# Patient Record
Sex: Female | Born: 1965 | Race: White | Hispanic: No | Marital: Married | Smoking: Never smoker
Health system: Southern US, Community
[De-identification: ages and names within clinical notes are randomized; demographics above are authoritative.]

---

## 2018-09-10 ENCOUNTER — Encounter (HOSPITAL_COMMUNITY): Payer: Self-pay | Admitting: Emergency Medicine

## 2018-09-10 ENCOUNTER — Emergency Department (HOSPITAL_COMMUNITY)
Admission: EM | Admit: 2018-09-10 | Discharge: 2018-09-11 | Discharge status: Against Medical Advice | Payer: 59 | Attending: Emergency Medicine | Admitting: Emergency Medicine

## 2018-09-10 ENCOUNTER — Other Ambulatory Visit: Payer: Self-pay

## 2018-09-10 DIAGNOSIS — Z5321 Procedure and treatment not carried out due to patient leaving prior to being seen by health care provider: Secondary | ICD-10-CM | POA: Diagnosis not present

## 2018-09-10 DIAGNOSIS — Z0441 Encounter for examination and observation following alleged adult rape: Secondary | ICD-10-CM | POA: Diagnosis not present

## 2018-09-10 DIAGNOSIS — N939 Abnormal uterine and vaginal bleeding, unspecified: Secondary | ICD-10-CM | POA: Diagnosis present
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
## 2018-09-11 ENCOUNTER — Emergency Department (HOSPITAL_COMMUNITY): Payer: 59

## 2018-09-11 ENCOUNTER — Other Ambulatory Visit: Payer: Self-pay

## 2018-09-11 ENCOUNTER — Encounter (HOSPITAL_COMMUNITY): Payer: Self-pay

## 2018-09-11 ENCOUNTER — Ambulatory Visit (HOSPITAL_COMMUNITY)
Admission: EM | Admit: 2018-09-11 | Discharge: 2018-09-11 | Disposition: A | Discharge status: Home / Self Care | Payer: No Typology Code available for payment source | Attending: Emergency Medicine | Admitting: Emergency Medicine

## 2018-09-11 ENCOUNTER — Emergency Department (HOSPITAL_COMMUNITY)
Admission: EM | Admit: 2018-09-11 | Discharge: 2018-09-11 | Disposition: A | Discharge status: Home / Self Care | Payer: 59 | Source: Home / Self Care | Attending: Emergency Medicine | Admitting: Emergency Medicine

## 2018-09-11 DIAGNOSIS — S5011XA Contusion of right forearm, initial encounter: Secondary | ICD-10-CM

## 2018-09-11 DIAGNOSIS — Y9289 Other specified places as the place of occurrence of the external cause: Secondary | ICD-10-CM | POA: Insufficient documentation

## 2018-09-11 DIAGNOSIS — K625 Hemorrhage of anus and rectum: Secondary | ICD-10-CM | POA: Insufficient documentation

## 2018-09-11 DIAGNOSIS — S5012XA Contusion of left forearm, initial encounter: Secondary | ICD-10-CM

## 2018-09-11 DIAGNOSIS — S8011XA Contusion of right lower leg, initial encounter: Secondary | ICD-10-CM

## 2018-09-11 DIAGNOSIS — Z0441 Encounter for examination and observation following alleged adult rape: Secondary | ICD-10-CM | POA: Insufficient documentation

## 2018-09-11 DIAGNOSIS — Y939 Activity, unspecified: Secondary | ICD-10-CM | POA: Insufficient documentation

## 2018-09-11 DIAGNOSIS — N939 Abnormal uterine and vaginal bleeding, unspecified: Secondary | ICD-10-CM | POA: Insufficient documentation

## 2018-09-11 DIAGNOSIS — Y999 Unspecified external cause status: Secondary | ICD-10-CM

## 2018-09-11 DIAGNOSIS — S8012XA Contusion of left lower leg, initial encounter: Secondary | ICD-10-CM

## 2018-09-11 DIAGNOSIS — E876 Hypokalemia: Secondary | ICD-10-CM

## 2018-09-11 LAB — URINALYSIS, ROUTINE W REFLEX MICROSCOPIC
Bilirubin Urine: NEGATIVE
Glucose, UA: NEGATIVE mg/dL
Ketones, ur: NEGATIVE mg/dL
Nitrite: NEGATIVE
Protein, ur: NEGATIVE mg/dL
Specific Gravity, Urine: 1.014 (ref 1.005–1.030)
pH: 7 (ref 5.0–8.0)

## 2018-09-11 LAB — CBC WITH DIFFERENTIAL/PLATELET
Abs Immature Granulocytes: 0.02 10*3/uL (ref 0.00–0.07)
Basophils Absolute: 0 10*3/uL (ref 0.0–0.1)
Basophils Relative: 1 %
EOS PCT: 2 %
Eosinophils Absolute: 0.1 10*3/uL (ref 0.0–0.5)
HCT: 36 % (ref 36.0–46.0)
Hemoglobin: 11.4 g/dL — ABNORMAL LOW (ref 12.0–15.0)
Immature Granulocytes: 0 %
LYMPHS PCT: 32 %
Lymphs Abs: 2 10*3/uL (ref 0.7–4.0)
MCH: 27.7 pg (ref 26.0–34.0)
MCHC: 31.7 g/dL (ref 30.0–36.0)
MCV: 87.6 fL (ref 80.0–100.0)
Monocytes Absolute: 0.6 10*3/uL (ref 0.1–1.0)
Monocytes Relative: 10 %
Neutro Abs: 3.4 10*3/uL (ref 1.7–7.7)
Neutrophils Relative %: 55 %
Platelets: 235 10*3/uL (ref 150–400)
RBC: 4.11 MIL/uL (ref 3.87–5.11)
RDW: 14 % (ref 11.5–15.5)
WBC: 6.1 10*3/uL (ref 4.0–10.5)
nRBC: 0 % (ref 0.0–0.2)

## 2018-09-11 LAB — RAPID URINE DRUG SCREEN, HOSP PERFORMED
Amphetamines: NOT DETECTED
Barbiturates: NOT DETECTED
Benzodiazepines: NOT DETECTED
COCAINE: POSITIVE — AB
Opiates: NOT DETECTED
Tetrahydrocannabinol: POSITIVE — AB

## 2018-09-11 LAB — COMPREHENSIVE METABOLIC PANEL
ALT: 17 U/L (ref 0–44)
AST: 26 U/L (ref 15–41)
Albumin: 4 g/dL (ref 3.5–5.0)
Alkaline Phosphatase: 60 U/L (ref 38–126)
Anion gap: 10 (ref 5–15)
BUN: 6 mg/dL (ref 6–20)
CALCIUM: 9.4 mg/dL (ref 8.9–10.3)
CO2: 18 mmol/L — ABNORMAL LOW (ref 22–32)
Chloride: 110 mmol/L (ref 98–111)
Creatinine, Ser: 0.91 mg/dL (ref 0.44–1.00)
GFR calc Af Amer: >60 mL/min (ref >60)
Glucose, Bld: 95 mg/dL (ref 70–99)
Potassium: 2.9 mmol/L — ABNORMAL LOW (ref 3.5–5.1)
Sodium: 138 mmol/L (ref 135–145)
TOTAL PROTEIN: 7.1 g/dL (ref 6.5–8.1)
Total Bilirubin: 0.7 mg/dL (ref 0.3–1.2)

## 2018-09-11 LAB — ETHANOL: Alcohol, Ethyl (B): <10 mg/dL (ref <10)

## 2018-09-11 MED ORDER — AZITHROMYCIN 250 MG PO TABS
1000.0000 mg | ORAL_TABLET | Freq: Once | ORAL | Status: AC
  Administered 2018-09-11: 1000 mg via ORAL
  Filled 2018-09-11: qty 4

## 2018-09-11 MED ORDER — METRONIDAZOLE 500 MG PO TABS
2000.0000 mg | ORAL_TABLET | Freq: Once | ORAL | Status: AC
  Administered 2018-09-11: 2000 mg via ORAL
  Filled 2018-09-11: qty 4

## 2018-09-11 MED ORDER — CEFTRIAXONE SODIUM 250 MG IJ SOLR
250.0000 mg | Freq: Once | INTRAMUSCULAR | Status: AC
  Administered 2018-09-11: 250 mg via INTRAMUSCULAR
  Filled 2018-09-11: qty 250

## 2018-09-11 MED ORDER — LIDOCAINE HCL (PF) 1 % IJ SOLN
0.9000 mL | Freq: Once | INTRAMUSCULAR | Status: AC
  Administered 2018-09-11: 0.9 mL
  Filled 2018-09-11: qty 5
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
## 2018-09-11 NOTE — ED Notes (Signed)
SANE RN at bedside.
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
## 2018-09-11 NOTE — ED Notes (Signed)
Manufacturing systems engineer notified and communication with this RN has been completed. She will be in route to our facility.
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
## 2018-09-11 NOTE — ED Notes (Signed)
Patient transported to X-ray 
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
## 2018-09-12 LAB — RPR: RPR Ser Ql: NONREACTIVE

## 2018-09-12 LAB — HIV ANTIBODY (ROUTINE TESTING W REFLEX): HIV Screen 4th Generation wRfx: NONREACTIVE

## 2018-09-13 LAB — KETAMINE, URINE QUAL: Ketamine, Urine: NEGATIVE ng/mL

## 2018-09-15 LAB — CARISOPRODOL (SOMA), SERUM
Carisoprodol, Serum: <1 ug/mL (ref <20.0)
Meprobamate, Serum: <1 ug/mL — ABNORMAL LOW (ref 6.0–12.0)

## 2018-09-16 LAB — KETAMINE AND MTB. QT, S/P
Ketamine: NEGATIVE ng/mL
Norketamine: NEGATIVE ng/mL
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
## 2018-09-22 LAB — GHB SCREEN, S/P: GHB: NEGATIVE

## 2018-09-23 ENCOUNTER — Other Ambulatory Visit: Payer: Self-pay

## 2018-09-23 ENCOUNTER — Encounter (HOSPITAL_COMMUNITY): Payer: Self-pay

## 2018-09-23 ENCOUNTER — Ambulatory Visit (HOSPITAL_COMMUNITY)
Admission: EM | Admit: 2018-09-23 | Discharge: 2018-09-23 | Disposition: A | Discharge status: Home / Self Care | Payer: 59 | Attending: Family Medicine | Admitting: Family Medicine

## 2018-09-23 DIAGNOSIS — Z202 Contact with and (suspected) exposure to infections with a predominantly sexual mode of transmission: Secondary | ICD-10-CM | POA: Diagnosis not present

## 2018-09-23 DIAGNOSIS — Z113 Encounter for screening for infections with a predominantly sexual mode of transmission: Secondary | ICD-10-CM | POA: Insufficient documentation

## 2018-09-23 NOTE — Discharge Instructions (Addendum)
We are testing you for gonorrhea, chlamydia and trichomonas. Your HIV and syphilis test were negative.  Your hepatitis B was negative. Keep in contact with the hospital to try to get records for hepatitis B injections Follow-up with your hepatitis C screening as scheduled and you may want a repeat HIV testing in a few months. You were treated for gonorrhea, chlamydia and trichomonas at the hospital. Follow up as needed for continued or worsening symptoms
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
## 2018-09-24 LAB — CERVICOVAGINAL ANCILLARY ONLY
Bacterial vaginitis: POSITIVE — AB
Candida vaginitis: NEGATIVE
Chlamydia: NEGATIVE
Neisseria Gonorrhea: NEGATIVE
Trichomonas: NEGATIVE

## 2018-09-25 ENCOUNTER — Telehealth (HOSPITAL_COMMUNITY): Payer: Self-pay | Admitting: Emergency Medicine

## 2018-09-25 MED ORDER — METRONIDAZOLE 500 MG PO TABS: 500.0000 mg | ORAL_TABLET | Freq: Two times a day (BID) | ORAL | 0 refills | Status: AC

## 2018-09-25 NOTE — Telephone Encounter (Signed)
Bacterial vaginosis is positive. This was not treated at the urgent care visit.  Flagyl 500 mg BID x 7 days #14 no refills sent to patients pharmacy of choice.    Patient contacted and made aware of all results, all questions answered.  

## 2018-09-29 NOTE — [facility] Note (Signed)
09/29/2018 at 0810am  Sandra Woodward had left a VM asking for drug test results.  This FNE called to advise her that results were positive for cocaine and marijuana on her visit of March 5th.  Pt states, "I don't do cocaine."  There was a long pause.  Then pt verbalized appreciation for return phone call.

## 2020-03-14 IMAGING — DX DG ABDOMEN 1V
1 series · 1 of 1 positions shown · non-contrast
Comparison: None.

CLINICAL DATA: Bloody diarrhea for 4 days. No fever. Abdominal
pain.

EXAM:
ABDOMEN - 1 VIEW

[abdomen kub]
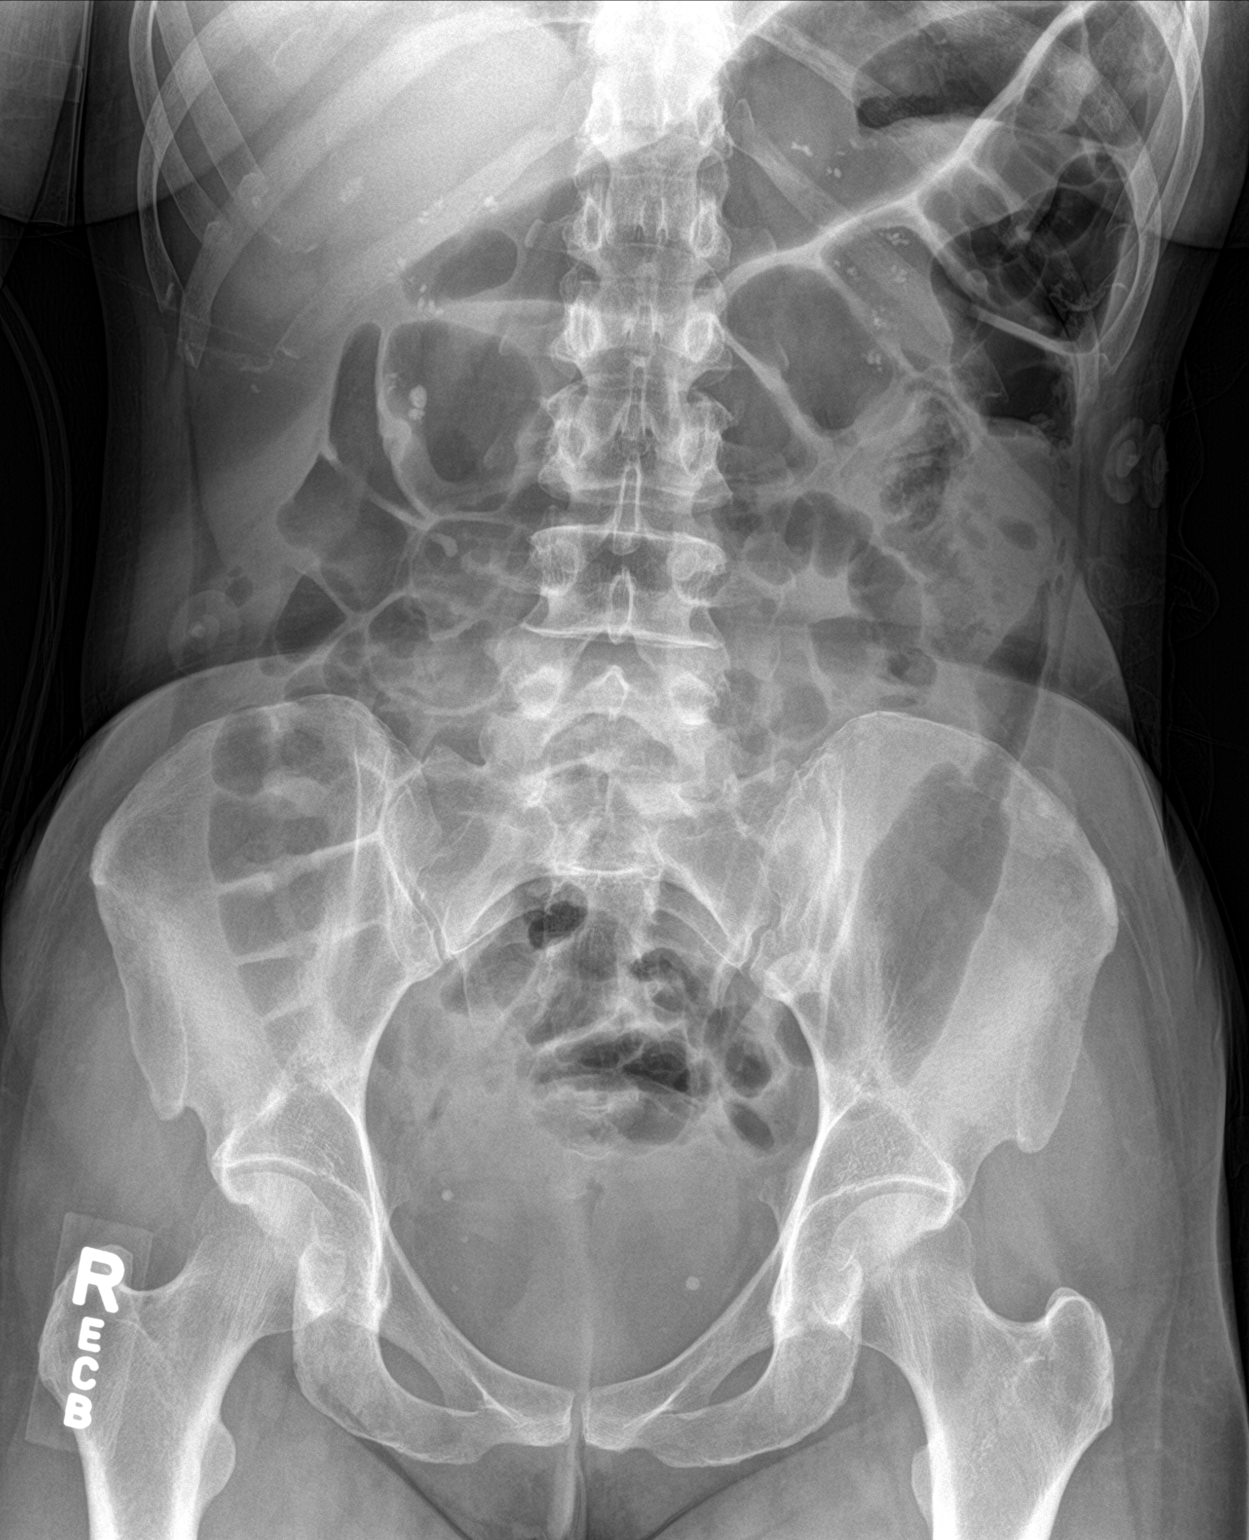

[1 of 1 positions shown; findings below may reference images not displayed]

FINDINGS: There is generalized increased bowel gas, but no bowel dilation to
suggest obstruction.

There are numerous calcifications in the right and left upper
abdomen is, which project within the kidneys. No evidence of a
ureteral stone.

Soft tissues are otherwise unremarkable.

No skeletal abnormality.
IMPRESSION: 1. No acute findings.
2. Generalized increased bowel gas with no evidence of bowel
obstruction.
3. Bilateral intrarenal stones.
